# Patient Record
Sex: Female | Born: 2000 | Race: Black or African American | Hispanic: No | Marital: Single | Smoking: Never smoker
Health system: Southern US, Community
[De-identification: ages and names within clinical notes are randomized; demographics above are authoritative.]

---
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
## 2021-03-23 IMAGING — CR DG CHEST 2V
2 series · 2 of 2 positions shown · non-contrast
Comparison: None.

CLINICAL DATA: Chest pain and shortness of breath for a few days.

EXAM:
CHEST - 2 VIEW

[chest pa]
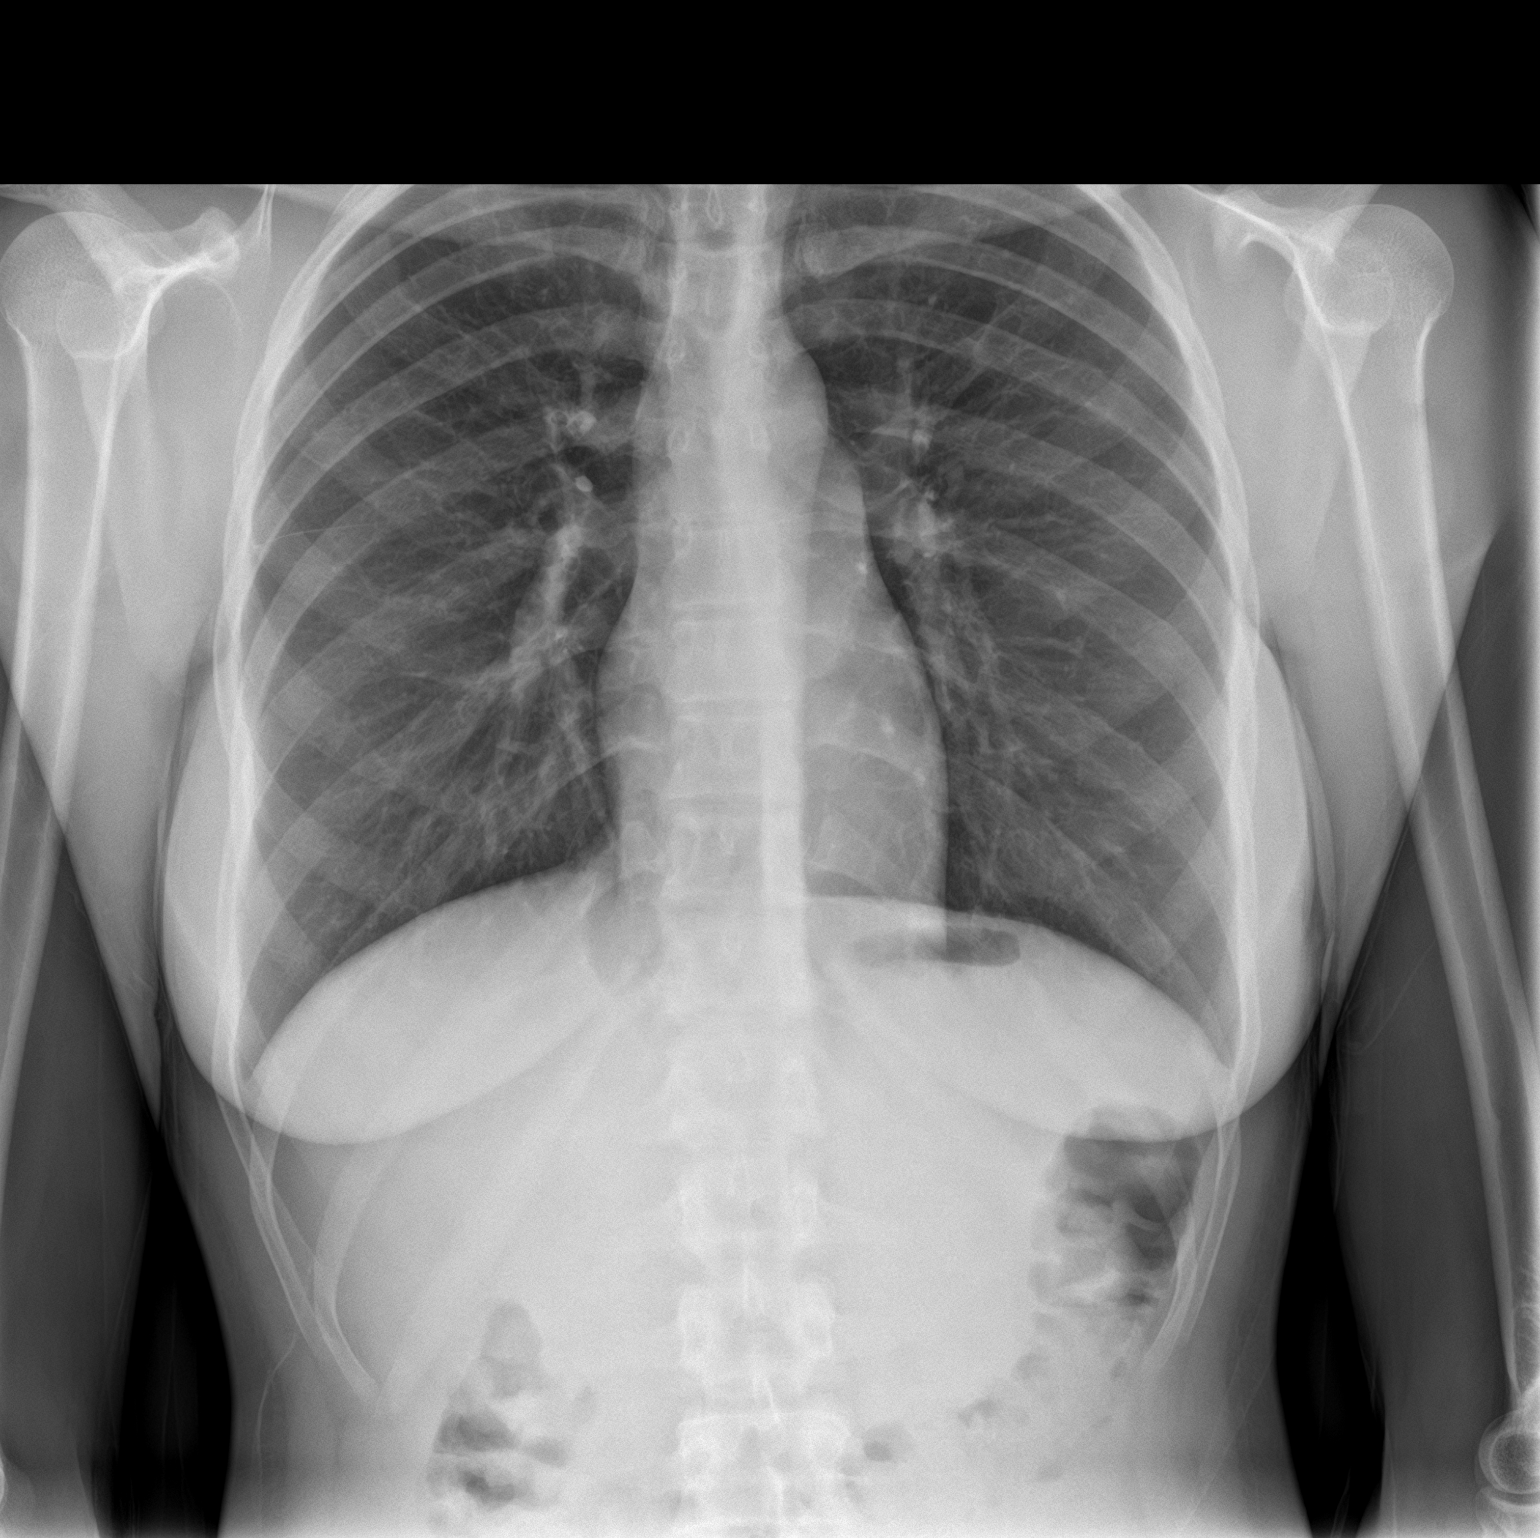

[chest lat]
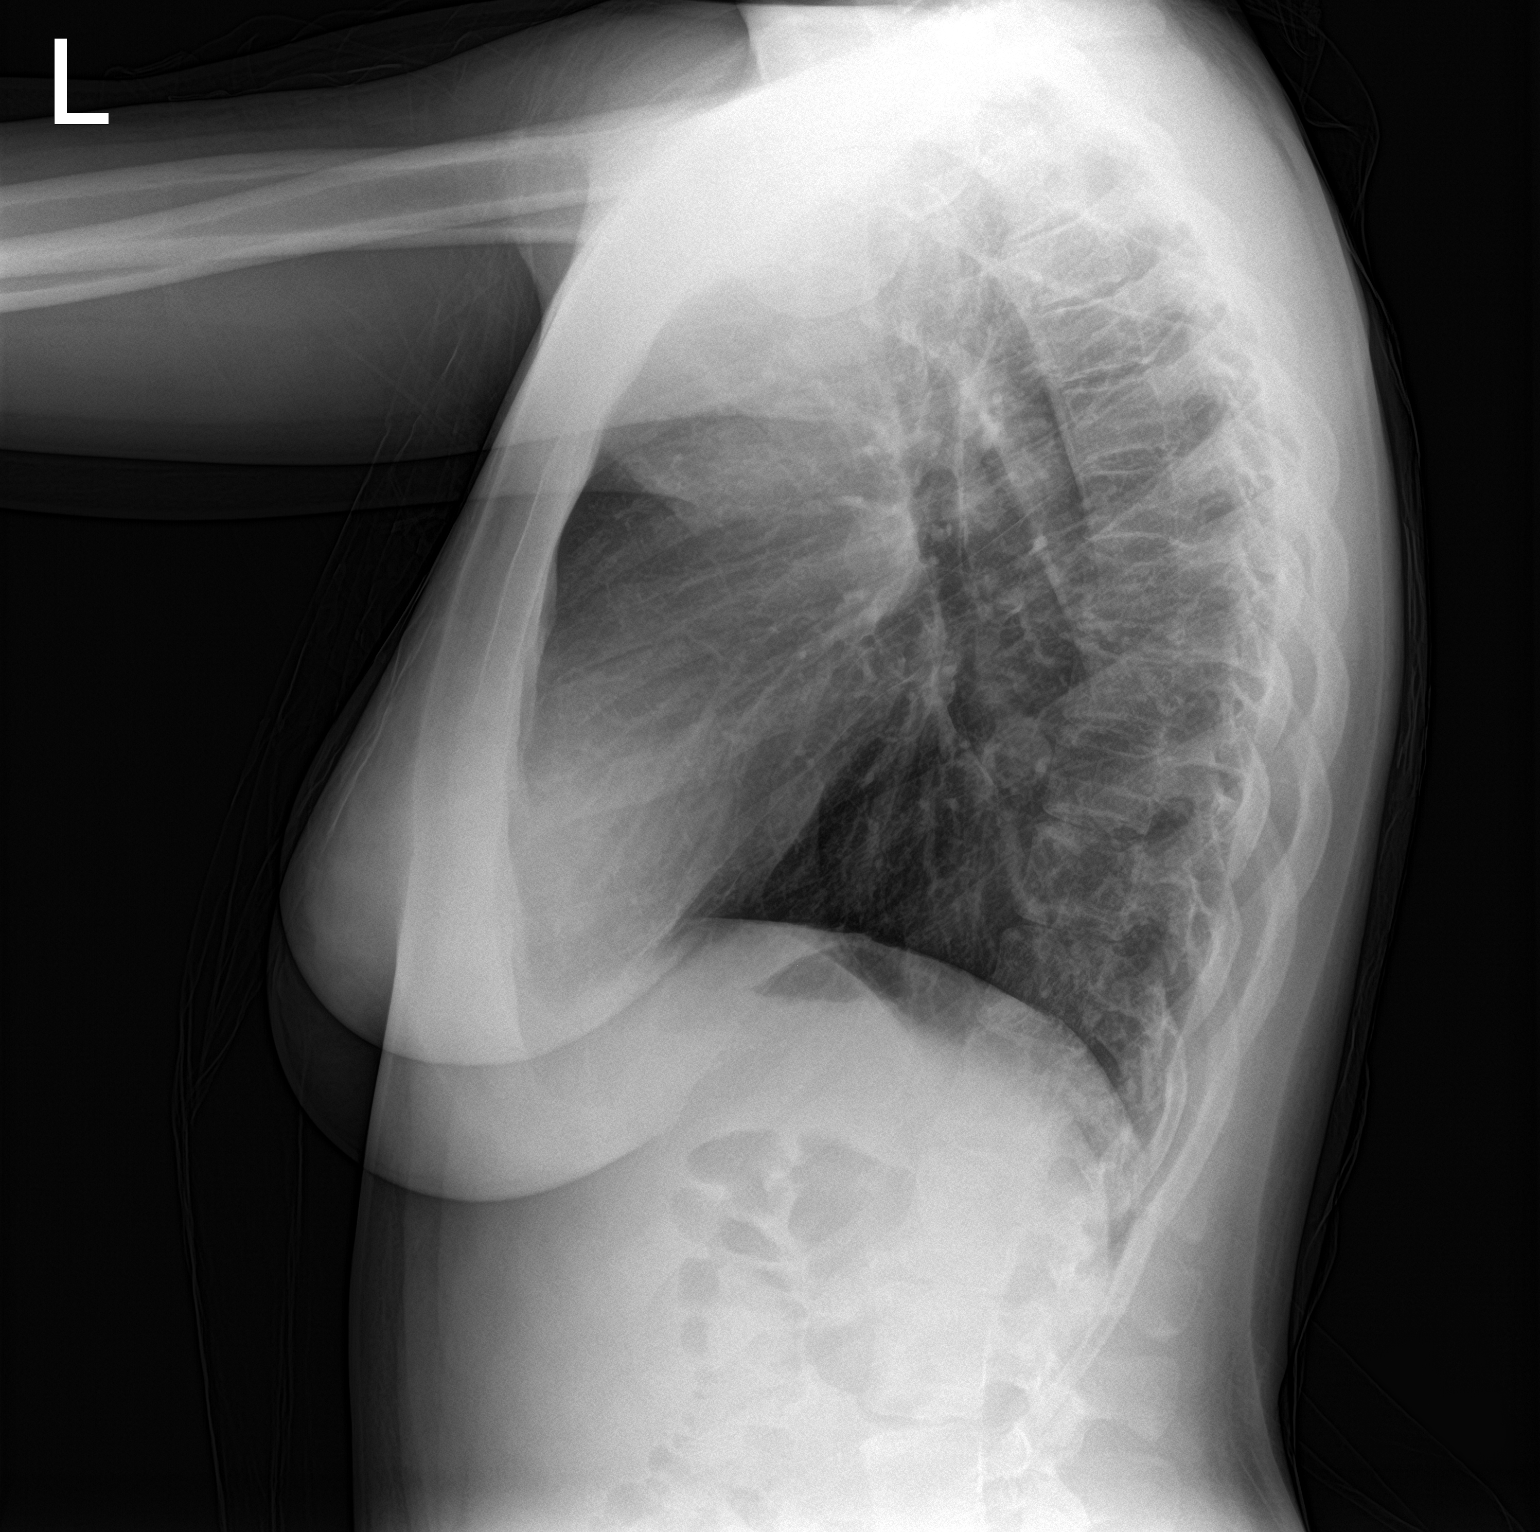

[2 of 2 positions shown; findings below may reference images not displayed]

FINDINGS: The heart size and mediastinal contours are within normal limits.
Both lungs are clear. The visualized skeletal structures are
unremarkable.
IMPRESSION: No active cardiopulmonary disease.

## 2022-02-16 ENCOUNTER — Other Ambulatory Visit: Payer: Self-pay

## 2022-02-16 ENCOUNTER — Encounter: Payer: Self-pay | Admitting: Medical

## 2022-02-16 ENCOUNTER — Ambulatory Visit (INDEPENDENT_AMBULATORY_CARE_PROVIDER_SITE_OTHER): Payer: BC Managed Care – PPO | Admitting: Medical

## 2022-02-16 VITALS — BP 108/72 | HR 109 | Temp 99.1°F | Ht 68.5 in | Wt 137.0 lb

## 2022-02-16 DIAGNOSIS — Z113 Encounter for screening for infections with a predominantly sexual mode of transmission: Secondary | ICD-10-CM

## 2022-02-16 NOTE — Progress Notes (Signed)
[facility] [address] [place], [place] [address] Phone: [phone] Fax: [phone]   Office Visit Note  Patient Name: Leslie Garza  Date of Birth:08-14-2000  Med Rec number [id number]  Date of Service: 02/16/2022  Allergies: Peanut-containing drug products and Penicillins  Chief Complaint  Patient presents with   STI Testing     HPI Would like to have STI screening. Last STI screening about a year ago, was negative. Had pap smear in Feb 2023, was normal.  A sexual partner she had in January 2023 (vaginal intercourse without condom) recently notified her he had tested positive for Herpes, did not say if symptomatic or type of test performed. Pt not in regular contact with him.  Pt denies any concerning symptoms.   Current Medication:  Outpatient Encounter Medications as of 02/16/2022  Medication Sig   norethindrone-ethinyl estradiol-FE (LOESTRIN FE) 1-20 MG-MCG tablet Take 1 tablet by mouth daily.   [DISCONTINUED] UNABLE TO FIND Birth Control   No facility-administered encounter medications on file as of 02/16/2022.      Medical History: History reviewed. No pertinent past medical history.   Vital Signs: BP 108/72   Pulse (!) 109   Temp 99.1 F (37.3 C) (Tympanic)   Ht 5' 8.5" (1.74 m)   Wt 137 lb (62.1 kg)   SpO2 98%   BMI 20.53 kg/m    Review of Systems  Constitutional: Negative.   Genitourinary:  Negative for dysuria, genital sores, pelvic pain, vaginal discharge and vaginal pain.    Physical Exam Vitals reviewed.  Constitutional:      General: She is not in acute distress.    Appearance: She is not ill-appearing.  Neurological:     Mental Status: She is alert.       Assessment/Plan: 1. Screening examination for STD (sexually transmitted disease) Discussed available testing options.  Patient elected for Gonorrhea, Chlamydia, Trichomonas, Syphilis, HIV testing today. Discussed limitations of antibody testing for herpes simplex  virus (i.e. positive result not necessarily helpful for directing management), test was offered if desired, patient declined testing.  Patient will be contacted via MyChart when labs results are available.  Encouraged consistent condom use, as well as regular testing (every 3-6 months) when changing sex partners, or when having multiple partners.    - RPR Qual - HIV Antibody (routine testing w rflx) - Chlamydia/Gonococcus/Trichomonas, NAA  Patient Instructions:  -You will receive a MyChart message notifying you of your lab results when they are available.  -Use condoms consistently to help protect against sexually transmitted infections and as back up birth control.   -Schedule return visit if you develop any symptoms (i.e. genital sores or pain, vaginal discharge).  General Counseling: Tashina verbalizes understanding of the findings of todays visit and agrees with plan of treatment. she has been encouraged to call the office with any questions or concerns that should arise related to todays visit.   Orders Placed This Encounter  Procedures   Chlamydia/Gonococcus/Trichomonas, NAA   RPR Qual   HIV Antibody (routine testing w rflx)    No orders of the defined types were placed in this encounter.   Time spent:20 Minutes    Jonathon Resides PA-C [facility] 02/16/2022 9:28 PM

## 2022-02-16 NOTE — Patient Instructions (Signed)
-  You will receive a MyChart message notifying you of your lab results when they are available.  -Use condoms consistently to help protect against sexually transmitted infections and as back up birth control.   -Schedule return visit if you develop any symptoms (i.e. genital sores or pain, vaginal discharge).

## 2022-02-17 LAB — HIV ANTIBODY (ROUTINE TESTING W REFLEX): HIV Screen 4th Generation wRfx: NONREACTIVE

## 2022-02-17 LAB — RPR QUALITATIVE: RPR Ser Ql: NONREACTIVE

## 2022-02-18 LAB — CHLAMYDIA/GONOCOCCUS/TRICHOMONAS, NAA
Chlamydia by NAA: NEGATIVE
Gonococcus by NAA: NEGATIVE
Trich vag by NAA: NEGATIVE

## 2022-05-07 ENCOUNTER — Ambulatory Visit (INDEPENDENT_AMBULATORY_CARE_PROVIDER_SITE_OTHER): Payer: Federal, State, Local not specified - PPO | Admitting: Oncology

## 2022-05-07 VITALS — BP 104/60 | HR 77 | Temp 97.8°F | Resp 18 | Ht 68.5 in | Wt 138.0 lb

## 2022-05-07 DIAGNOSIS — Z3041 Encounter for surveillance of contraceptive pills: Secondary | ICD-10-CM

## 2022-05-07 MED ORDER — NORETHIN ACE-ETH ESTRAD-FE 1-20 MG-MCG PO TABS: 1.0000 | ORAL_TABLET | Freq: Every day | ORAL | 4 refills | Status: AC

## 2022-05-07 NOTE — Progress Notes (Signed)
[address] [place], [place] [address] Phone: [phone] Fax: [phone]   Office Visit Note  Patient Name: Leslie Garza  Date of [id number]  Med Rec number [id number]  Date of Service: 05/07/2022  Peanut-containing drug products and Penicillins  No chief complaint on file.  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 Has been on [facility] since Jan 2023. Deatra Robinson performed Pap smear back in Jan 2023. Student reports pap smear was normal. Results are in [facility] (old system). Tolerating BC well. 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   Current Medication:  Outpatient Encounter Medications as of 05/07/2022  Medication Sig   norethindrone-ethinyl estradiol-FE (LOESTRIN FE) 1-20 MG-MCG tablet Take 1 tablet by mouth daily.   No facility-administered encounter medications on file as of 05/07/2022.   Medical History: No past medical history on file.  Vital Signs: There were no vitals taken for this visit.  ROS: As per HPI.  All other pertinent ROS negative.     Review of Systems  All other systems reviewed and are negative.   Physical Exam Vitals reviewed.  Constitutional:      Appearance: Normal appearance.  Neurological:     Mental Status: She is alert and oriented to person, place, and time.     No results found for this or any previous visit (from the past 24 hour(s)).  Assessment/Plan: 1. Encounter for surveillance of contraceptive pills -Reviewed Pap smear from 2023 in [place].  Will get report scanned into epic.  Repeat Pap smear in 2026. -Refilled birth control for 1 year.  Student to send me message if she needs it sent to [facility] when she graduates.  Disposition-return to clinic as needed.   General Counseling: Chandy verbalizes understanding of the findings of todays visit and agrees with plan of treatment. I have discussed any further diagnostic evaluation that may be  needed or ordered today. We also reviewed her medications today. she has been encouraged to call the office with any questions or concerns that should arise related to todays visit.   No orders of the defined types were placed in this encounter.   No orders of the defined types were placed in this encounter.   I spent 20 minutes dedicated to the care of this patient (face-to-face and non-face-to-face) on the date of the encounter to include what is described in the assessment and plan.   Faythe Casa, NP 05/07/2022 1:37 PM

## 2023-07-29 ENCOUNTER — Other Ambulatory Visit: Payer: Self-pay | Admitting: Oncology
